# Patient Record
Sex: Female | Born: 1950 | Race: Black or African American | Hispanic: No | Marital: Married | Smoking: Never smoker
Health system: Southern US, Community
[De-identification: ages and names within clinical notes are randomized; demographics above are authoritative.]

## PROBLEM LIST (undated history)

## (undated) DIAGNOSIS — E119 Type 2 diabetes mellitus without complications: Secondary | ICD-10-CM

## (undated) DIAGNOSIS — I10 Essential (primary) hypertension: Secondary | ICD-10-CM

## (undated) DIAGNOSIS — M1990 Unspecified osteoarthritis, unspecified site: Secondary | ICD-10-CM

---

## 2014-11-05 ENCOUNTER — Emergency Department (HOSPITAL_COMMUNITY)
Admission: EM | Admit: 2014-11-05 | Discharge: 2014-11-05 | Disposition: A | Discharge status: Home / Self Care | Payer: BLUE CROSS/BLUE SHIELD | Attending: Emergency Medicine | Admitting: Emergency Medicine

## 2014-11-05 ENCOUNTER — Emergency Department (HOSPITAL_COMMUNITY): Payer: BLUE CROSS/BLUE SHIELD

## 2014-11-05 ENCOUNTER — Encounter (HOSPITAL_COMMUNITY): Payer: Self-pay | Admitting: Emergency Medicine

## 2014-11-05 DIAGNOSIS — S79911A Unspecified injury of right hip, initial encounter: Secondary | ICD-10-CM | POA: Diagnosis not present

## 2014-11-05 DIAGNOSIS — Y92410 Unspecified street and highway as the place of occurrence of the external cause: Secondary | ICD-10-CM | POA: Diagnosis not present

## 2014-11-05 DIAGNOSIS — I10 Essential (primary) hypertension: Secondary | ICD-10-CM | POA: Insufficient documentation

## 2014-11-05 DIAGNOSIS — S4991XA Unspecified injury of right shoulder and upper arm, initial encounter: Secondary | ICD-10-CM | POA: Diagnosis not present

## 2014-11-05 DIAGNOSIS — S8992XA Unspecified injury of left lower leg, initial encounter: Secondary | ICD-10-CM | POA: Insufficient documentation

## 2014-11-05 DIAGNOSIS — Z79899 Other long term (current) drug therapy: Secondary | ICD-10-CM | POA: Diagnosis not present

## 2014-11-05 DIAGNOSIS — M1990 Unspecified osteoarthritis, unspecified site: Secondary | ICD-10-CM | POA: Diagnosis not present

## 2014-11-05 DIAGNOSIS — Y998 Other external cause status: Secondary | ICD-10-CM | POA: Insufficient documentation

## 2014-11-05 DIAGNOSIS — M25511 Pain in right shoulder: Secondary | ICD-10-CM

## 2014-11-05 DIAGNOSIS — M25562 Pain in left knee: Secondary | ICD-10-CM

## 2014-11-05 DIAGNOSIS — E119 Type 2 diabetes mellitus without complications: Secondary | ICD-10-CM | POA: Insufficient documentation

## 2014-11-05 DIAGNOSIS — Y9389 Activity, other specified: Secondary | ICD-10-CM | POA: Insufficient documentation

## 2014-11-05 DIAGNOSIS — S0990XA Unspecified injury of head, initial encounter: Secondary | ICD-10-CM | POA: Diagnosis not present

## 2014-11-05 DIAGNOSIS — M25551 Pain in right hip: Secondary | ICD-10-CM

## 2014-11-05 HISTORY — DX: Unspecified osteoarthritis, unspecified site: M19.90

## 2014-11-05 HISTORY — DX: Essential (primary) hypertension: I10

## 2014-11-05 HISTORY — DX: Type 2 diabetes mellitus without complications: E11.9

## 2014-11-05 MED ORDER — HYDROCODONE-ACETAMINOPHEN 5-325 MG PO TABS
1.0000 | ORAL_TABLET | ORAL | Status: AC | PRN
Start: 2014-11-05 — End: ?

## 2014-11-05 MED ORDER — METHOCARBAMOL 500 MG PO TABS: 500.0000 mg | ORAL_TABLET | Freq: Two times a day (BID) | ORAL | Status: AC

## 2014-11-05 MED ORDER — ACETAMINOPHEN 325 MG PO TABS
650.0000 mg | ORAL_TABLET | Freq: Once | ORAL | Status: AC
  Administered 2014-11-05: 650 mg via ORAL
  Filled 2014-11-05: qty 2

## 2014-11-05 MED ORDER — IBUPROFEN 600 MG PO TABS: 600.0000 mg | ORAL_TABLET | Freq: Four times a day (QID) | ORAL | Status: AC | PRN

## 2014-11-05 NOTE — ED Notes (Signed)
Patient transported to X-ray 
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
## 2014-11-05 NOTE — ED Notes (Signed)
Patient returned from X-ray 
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
